# Patient Record
Sex: Female | Born: 1990 | Race: Black or African American | Hispanic: No | Marital: Single
Health system: Southern US, Community
[De-identification: ages and names within clinical notes are randomized; demographics above are authoritative.]

## PROBLEM LIST (undated history)

## (undated) DIAGNOSIS — C801 Malignant (primary) neoplasm, unspecified: Secondary | ICD-10-CM

---

## 2020-06-02 ENCOUNTER — Other Ambulatory Visit: Payer: Self-pay

## 2020-06-02 ENCOUNTER — Emergency Department (HOSPITAL_COMMUNITY)
Admission: EM | Admit: 2020-06-02 | Discharge: 2020-06-02 | Disposition: A | Discharge status: Home / Self Care | Payer: Medicaid Other | Attending: Emergency Medicine | Admitting: Emergency Medicine

## 2020-06-02 ENCOUNTER — Encounter (HOSPITAL_COMMUNITY): Payer: Self-pay

## 2020-06-02 ENCOUNTER — Emergency Department (HOSPITAL_COMMUNITY): Payer: Medicaid Other

## 2020-06-02 DIAGNOSIS — R202 Paresthesia of skin: Secondary | ICD-10-CM | POA: Insufficient documentation

## 2020-06-02 DIAGNOSIS — M7918 Myalgia, other site: Secondary | ICD-10-CM | POA: Diagnosis not present

## 2020-06-02 DIAGNOSIS — Z5321 Procedure and treatment not carried out due to patient leaving prior to being seen by health care provider: Secondary | ICD-10-CM | POA: Diagnosis not present

## 2020-06-02 DIAGNOSIS — R079 Chest pain, unspecified: Secondary | ICD-10-CM | POA: Diagnosis not present

## 2020-06-02 HISTORY — DX: Malignant (primary) neoplasm, unspecified: C80.1

## 2020-06-02 LAB — BASIC METABOLIC PANEL
Anion gap: 12 (ref 5–15)
BUN: 9 mg/dL (ref 6–20)
CO2: 23 mmol/L (ref 22–32)
Calcium: 8.9 mg/dL (ref 8.9–10.3)
Chloride: 99 mmol/L (ref 98–111)
Creatinine, Ser: 0.71 mg/dL (ref 0.44–1.00)
GFR calc Af Amer: >60 mL/min (ref >60)
GFR calc non Af Amer: >60 mL/min (ref >60)
Glucose, Bld: 99 mg/dL (ref 70–99)
Potassium: 3.5 mmol/L (ref 3.5–5.1)
Sodium: 134 mmol/L — ABNORMAL LOW (ref 135–145)

## 2020-06-02 LAB — I-STAT BETA HCG BLOOD, ED (MC, WL, AP ONLY): I-stat hCG, quantitative: <5 m[IU]/mL (ref <5)

## 2020-06-02 LAB — TROPONIN I (HIGH SENSITIVITY): Troponin I (High Sensitivity): 4 ng/L (ref <18)

## 2020-06-02 NOTE — ED Triage Notes (Signed)
PT came in POV from c/o Chest Pain. Pt states it started earlier today, as well as increased stress from her home life. Pt is complaining of tingling of her legs. 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 She is complaining of a lot of generalized pain and states "I can not manage it".

## 2020-06-02 NOTE — ED Notes (Signed)
Pt stated she did not want to wait as she "waited 14 hours yesterday at [facility] and was not seen" pt left the building

## 2021-08-31 IMAGING — DX DG CHEST 2V
2 series · 2 of 2 positions shown · non-contrast
Comparison: No prior exams available for comparison.

CLINICAL DATA: Chest pain for 2 days.  Shortness of breath.

EXAM:
CHEST - 2 VIEW

[chest lat]
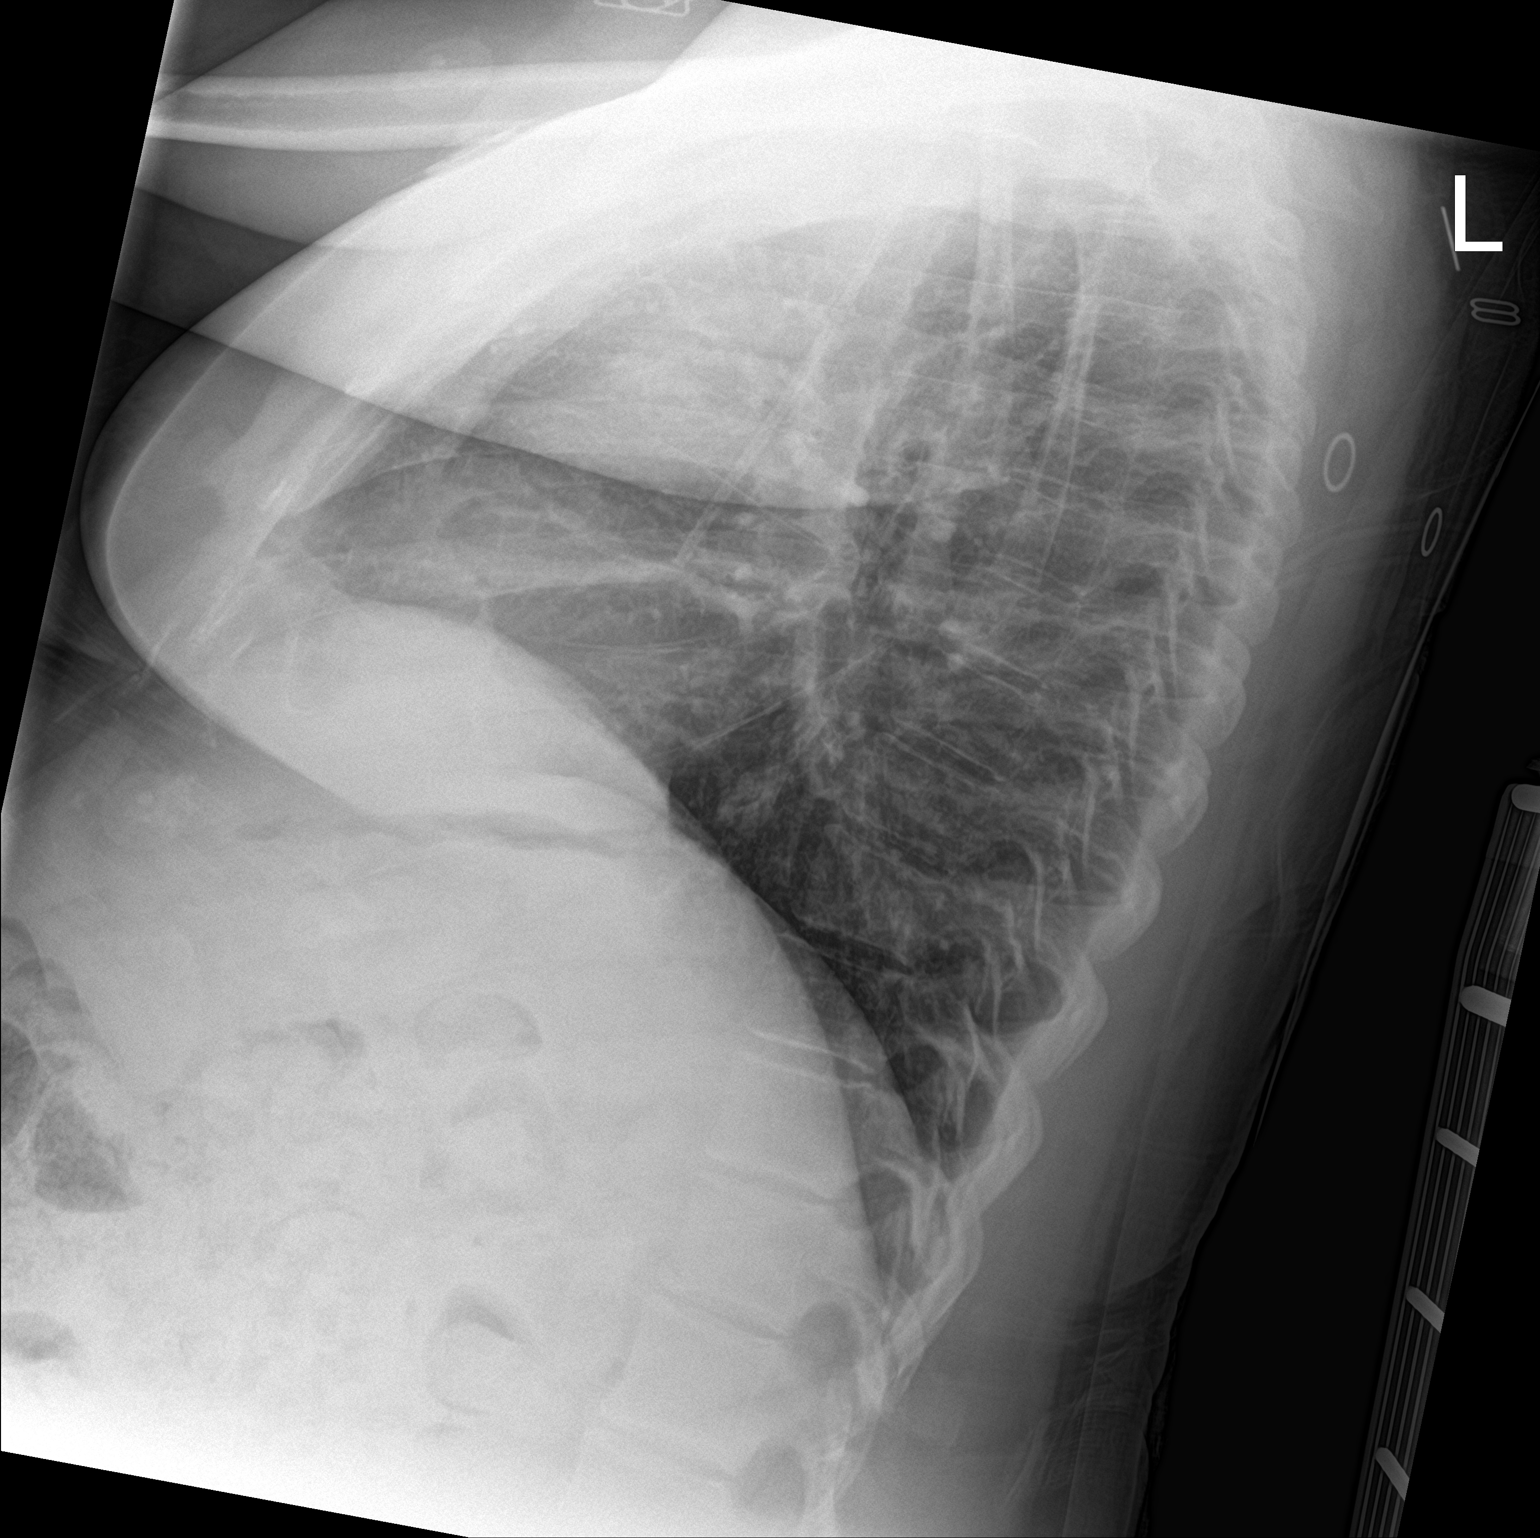

[chest ap]
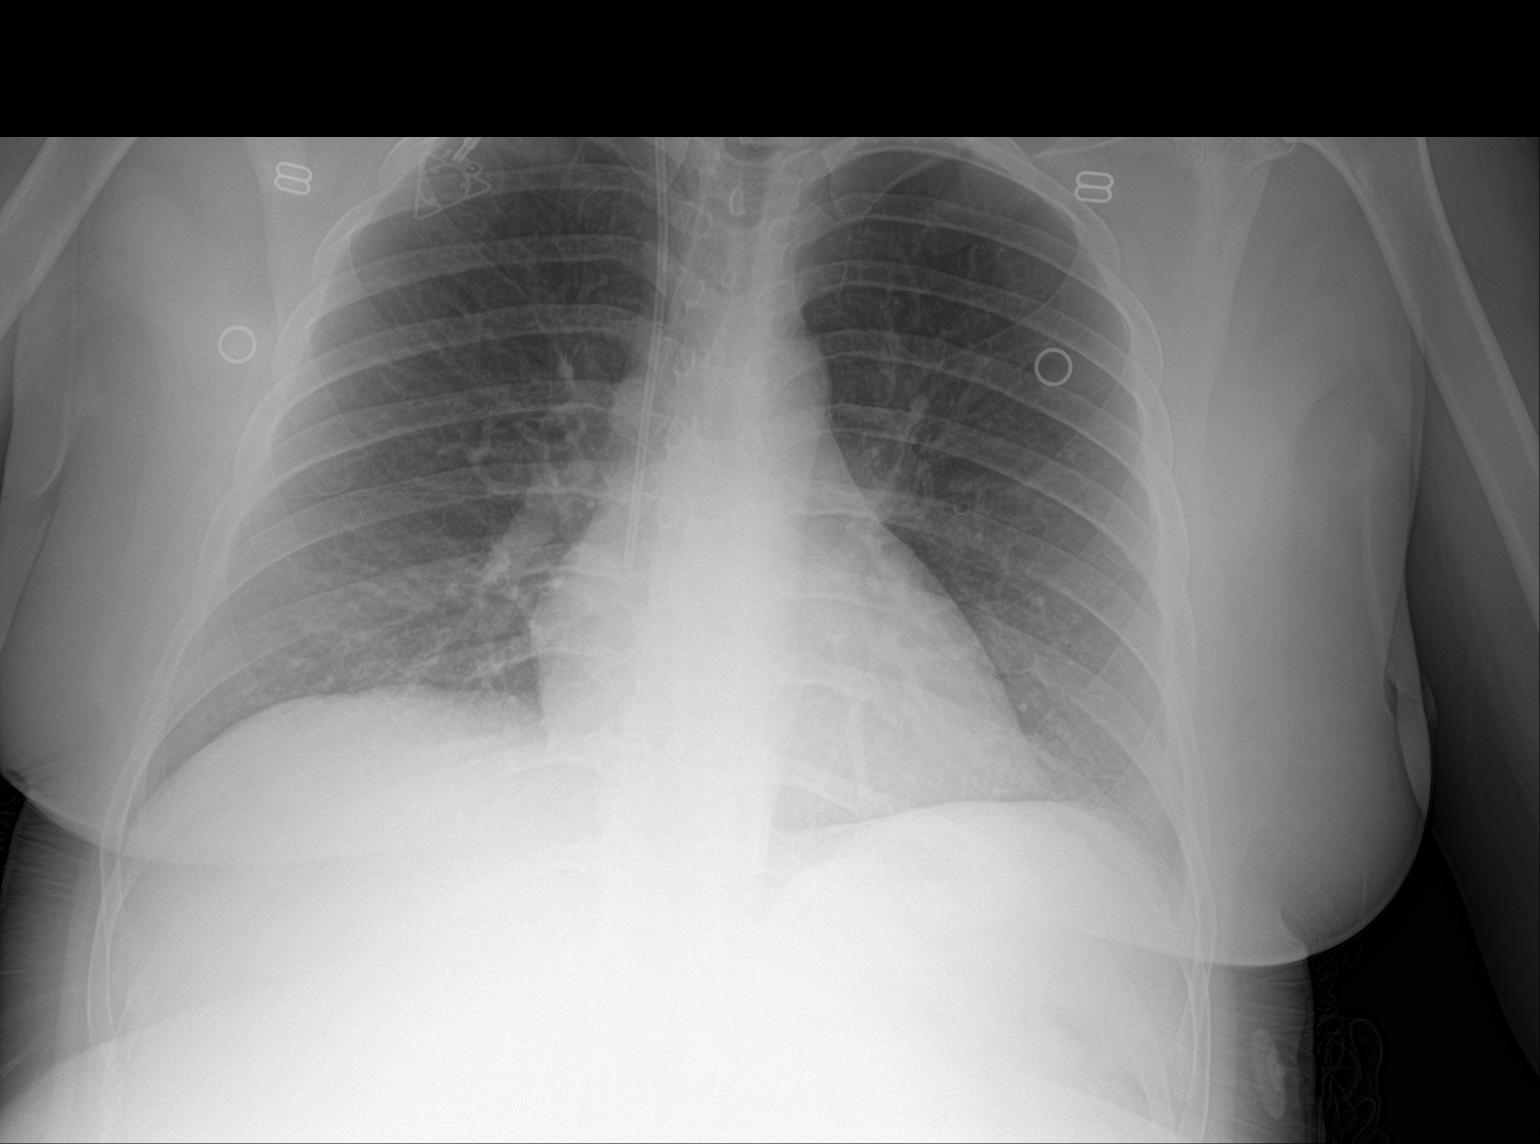

[2 of 2 positions shown; findings below may reference images not displayed]

Report from
abdominopelvic CT 2 days ago without an outside institution, images
not available. Report from chest CT 04/22/2020
FINDINGS: Right chest port in place with tip in the lower SVC.The
cardiomediastinal contours are normal. The consolidative opacity in
the left lower lobe on outside CT is not well-defined by radiograph.
Pulmonary vasculature is normal. No pneumothorax or large pleural
effusion. No acute osseous abnormalities are seen.
IMPRESSION: 1. Right chest port with tip in the SVC.
2. Recent abdominal CT 2 days ago at an outside institution
demonstrated left lower lobe 2 cm consolidative opacity, this is not
well demonstrated on the current exam.

## 2021-12-16 DEATH — deceased
